# Patient Record
Sex: Male | Born: 1975 | Race: Black or African American | Hispanic: No | Marital: Married | Smoking: Current every day smoker
Health system: Southern US, Community
[De-identification: ages and names within clinical notes are randomized; demographics above are authoritative.]

---

## 2015-06-08 ENCOUNTER — Encounter (HOSPITAL_BASED_OUTPATIENT_CLINIC_OR_DEPARTMENT_OTHER): Payer: Self-pay | Admitting: *Deleted

## 2015-06-08 ENCOUNTER — Emergency Department (HOSPITAL_BASED_OUTPATIENT_CLINIC_OR_DEPARTMENT_OTHER): Payer: Worker's Compensation

## 2015-06-08 ENCOUNTER — Emergency Department (HOSPITAL_BASED_OUTPATIENT_CLINIC_OR_DEPARTMENT_OTHER)
Admission: EM | Admit: 2015-06-08 | Discharge: 2015-06-08 | Disposition: A | Discharge status: Home / Self Care | Payer: Worker's Compensation | Attending: Emergency Medicine | Admitting: Emergency Medicine

## 2015-06-08 DIAGNOSIS — M545 Low back pain, unspecified: Secondary | ICD-10-CM

## 2015-06-08 DIAGNOSIS — F17210 Nicotine dependence, cigarettes, uncomplicated: Secondary | ICD-10-CM | POA: Insufficient documentation

## 2015-06-08 MED ORDER — NAPROXEN 500 MG PO TABS: 500.0000 mg | ORAL_TABLET | Freq: Two times a day (BID) | ORAL | Status: AC

## 2015-06-08 MED ORDER — KETOROLAC TROMETHAMINE 60 MG/2ML IM SOLN
60.0000 mg | Freq: Once | INTRAMUSCULAR | Status: AC
  Administered 2015-06-08: 60 mg via INTRAMUSCULAR
  Filled 2015-06-08: qty 2

## 2015-06-08 MED ORDER — OMEPRAZOLE 20 MG PO CPDR: 20.0000 mg | DELAYED_RELEASE_CAPSULE | Freq: Every day | ORAL | Status: AC

## 2015-06-08 MED ORDER — DEXAMETHASONE SODIUM PHOSPHATE 10 MG/ML IJ SOLN
10.0000 mg | Freq: Once | INTRAMUSCULAR | Status: AC
  Administered 2015-06-08: 10 mg via INTRAMUSCULAR
  Filled 2015-06-08: qty 1

## 2015-06-08 NOTE — ED Provider Notes (Signed)
CSN: [id number]     Arrival date & time 06/08/15  1652 History   First MD Initiated Contact with Patient 06/08/15 1917     Chief Complaint  Patient presents with  . Back Pain     (Consider location/radiation/quality/duration/timing/severity/associated sxs/prior Treatment) HPI Joseph Moon is a 40 y.o. male with PMH significant for HTN who presents with Gradual onset, constant, sharp, low back pain that radiates to his right thigh since this morning.  Patient reports he worked a 14 hour day yesterday involving repetitive lifting, bending, and long periods of in a seated position. He has taken 2 BC powders. His pain is relieved when he moves around and leans forward. No IVDU. No history of back procedures. Denies fever, chills, abdominal pain, urinary symptoms, bowel or bladder incontinence, numbness, weakness, gait abnormalities. Denies trauma or injury.  Patient does report he sees a gastroenterologist for what sounds like gastritis and is to be taking a PPI, but has not been compliant.   History reviewed. No pertinent past medical history. History reviewed. No pertinent past surgical history. History reviewed. No pertinent family history. Social History  Substance Use Topics  . Smoking status: Current Every Day Smoker -- 2.00 packs/day    Types: Cigarettes  . Smokeless tobacco: None  . Alcohol Use: No    Review of Systems All other systems negative unless otherwise stated in HPI    Allergies  Review of patient's allergies indicates no known allergies.  Home Medications   Prior to Admission medications   Medication Sig Start Date End Date Taking? Authorizing Provider  naproxen (NAPROSYN) 500 MG tablet Take 1 tablet (500 mg total) by mouth 2 (two) times daily. 06/08/15   Cheri FowlerKayla Neisha Hinger, PA-C  omeprazole (PRILOSEC) 20 MG capsule Take 1 capsule (20 mg total) by mouth daily. 06/08/15   Deny Chevez, PA-C   BP 148/88 mmHg  Pulse 90  Temp(Src) 97.7 F (36.5 C) (Oral)  Resp 18  Ht 5\' 8"   (1.727 m)  Wt 104.327 kg  BMI 34.98 kg/m2  SpO2 100% Physical Exam  Constitutional: He is oriented to person, place, and time. He appears well-developed and well-nourished.  HENT:  Head: Atraumatic.  Eyes: Conjunctivae are normal.  Cardiovascular: Normal rate, regular rhythm, normal heart sounds and intact distal pulses.   Pulses:      Dorsalis pedis pulses are 2+ on the right side, and 2+ on the left side.  Pulmonary/Chest: Effort normal and breath sounds normal.  Abdominal: Soft. Bowel sounds are normal. He exhibits no distension. There is no tenderness.  Musculoskeletal: He exhibits tenderness.  Spinous process tenderness throughout lumbar spine.  No step offs. No crepitus. No paralumbar tenderness.  No tenderness along sciatic notch.   Neurological: He is alert and oriented to person, place, and time.  No saddle anesthesia. Strength and sensation intact bilaterally throughout lower extremities. Strength 5/5 in lower extremities.  Gait normal.   Skin: Skin is warm and dry.  Psychiatric: He has a normal mood and affect. His behavior is normal.    ED Course  Procedures (including critical care time) Labs Review Labs Reviewed - No data to display  Imaging Review Dg Lumbar Spine Complete  06/08/2015  CLINICAL DATA:  40 year old male with back pain radiating down to the buttock area EXAM: LUMBAR SPINE - COMPLETE 4+ VIEW COMPARISON:  None. FINDINGS: There is no evidence of lumbar spine fracture. Alignment is normal. Intervertebral disc spaces are maintained. IMPRESSION: Unremarkable lumbar spine radiograph. Electronically Signed   By: Burtis JunesArash  Radparvar M.D.   On: 06/08/2015 20:15   I have personally reviewed and evaluated these images and lab results as part of my medical decision-making.   EKG Interpretation None      MDM   Final diagnoses:  Right-sided low back pain without sciatica   Patient presents with low back pain.  VSS.  No injury/trauma.  No red flags.  No neurological  deficits.  Distal pulses intact.  No gait abnormalities.  Possible mechanical cause vs spinal stenosis vs lumbar radiculopathy. Doubt cauda equina.  Doubt infectious process.  Doubt AAA.  Patient given Decadron and Toradol in ED.  Will discharge home with naproxen and omeprazole.  Discussed return precautions to the ED.  Follow up with PCP.       Cheri Fowler, PA-C 06/09/15 1610  Laurence Spates, MD 06/09/15 [phone]

## 2015-06-08 NOTE — ED Notes (Signed)
Pt reports that he woke up this morning with right lower back radiating down his buttock and into his legs.  Ambulatory.  WC case.

## 2015-06-08 NOTE — Discharge Instructions (Signed)

## 2016-04-16 ENCOUNTER — Ambulatory Visit (INDEPENDENT_AMBULATORY_CARE_PROVIDER_SITE_OTHER): Payer: Self-pay

## 2016-04-16 ENCOUNTER — Other Ambulatory Visit: Payer: Self-pay | Admitting: Emergency Medicine

## 2016-04-16 DIAGNOSIS — Z0000 Encounter for general adult medical examination without abnormal findings: Secondary | ICD-10-CM

## 2017-10-04 IMAGING — DX DG CHEST 1V
1 series · 1 of 1 positions shown · non-contrast
Comparison: 01/22/2015

CLINICAL DATA: Routine physical examination.

EXAM:
CHEST 1 VIEW

[chest pa]
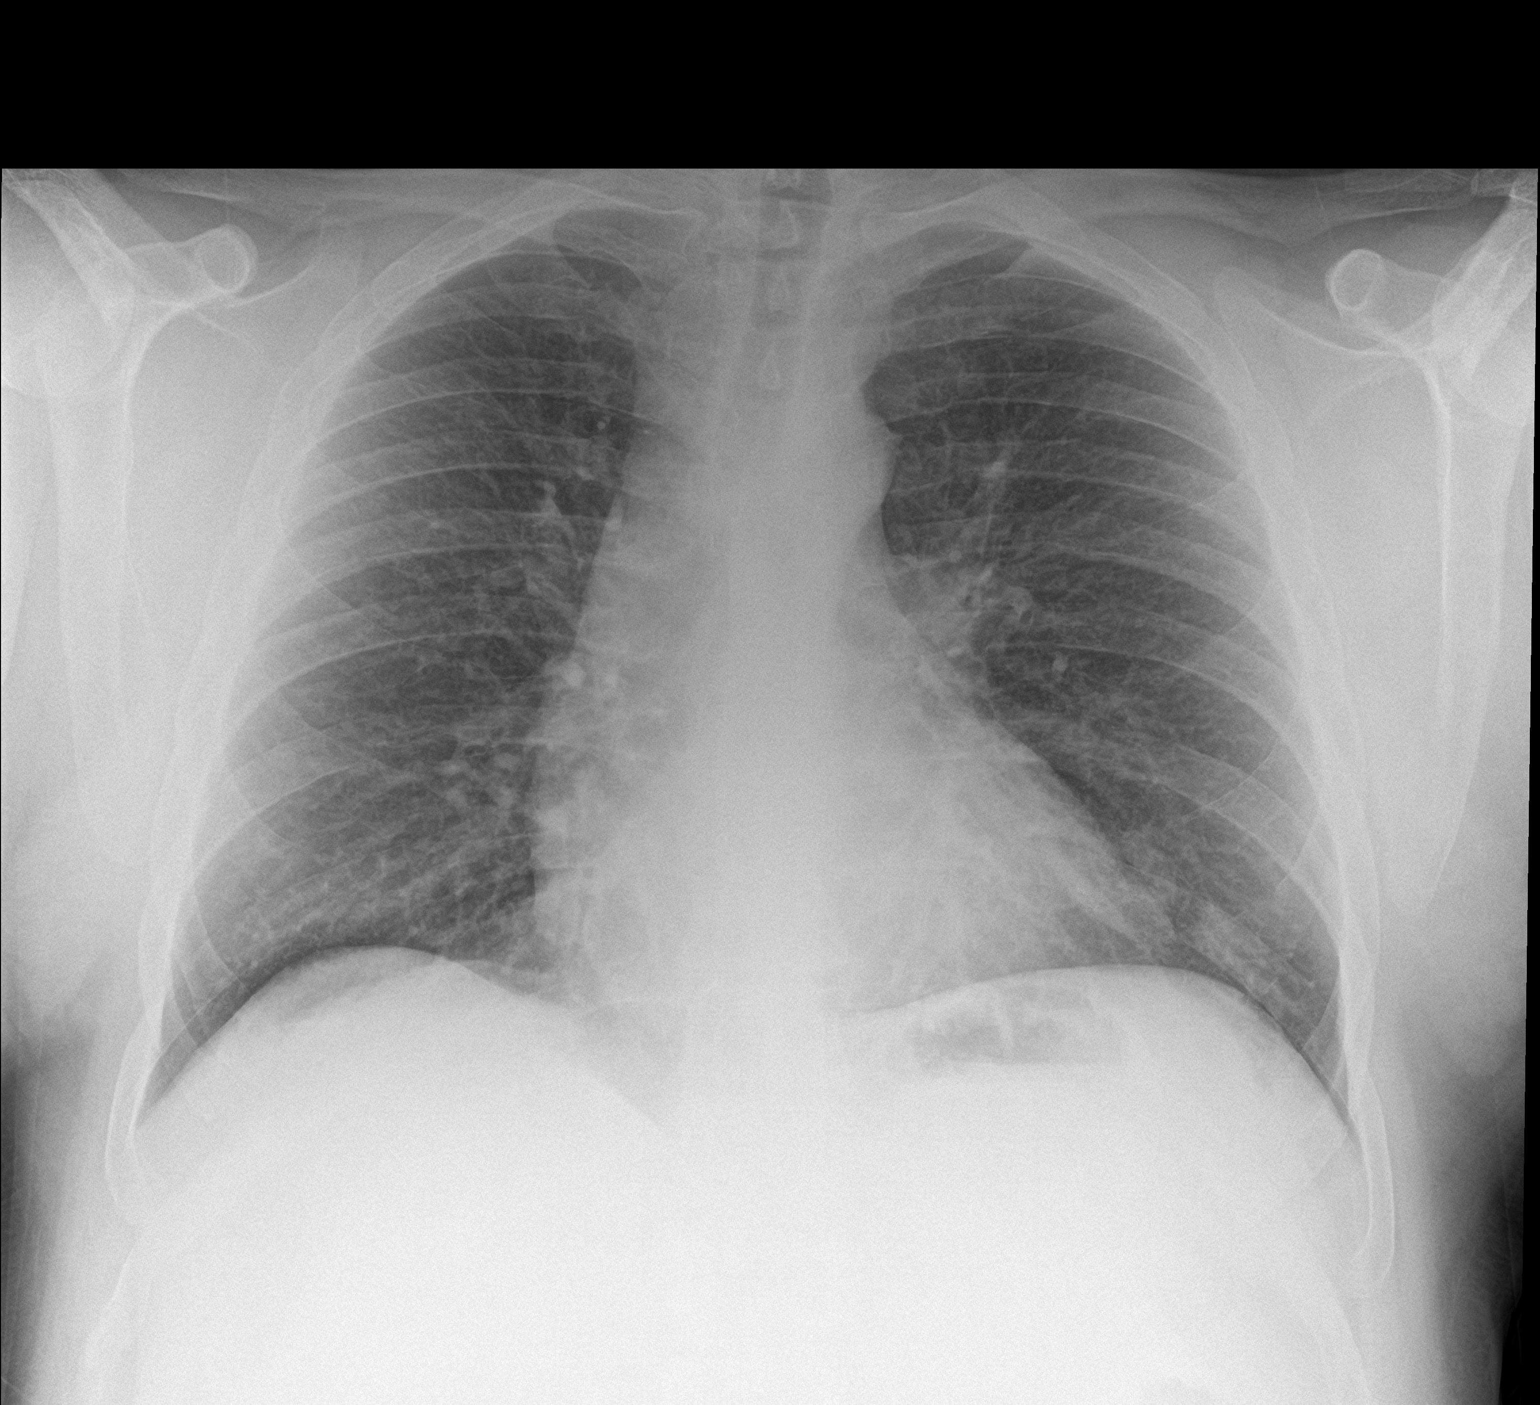

[1 of 1 positions shown; findings below may reference images not displayed]

FINDINGS: The cardiac silhouette, mediastinal and hilar contours are within
normal limits and stable. There are chronic bronchitic changes
likely related to smoking. No infiltrates, edema or effusions. No
worrisome pulmonary lesions.
IMPRESSION: Chronic bronchitic changes, likely related to smoking. No acute
overlying pulmonary findings or worrisome pulmonary lesions.
